# Patient Record
Sex: Female | Born: 1995 | Race: White | Hispanic: No | Marital: Single | Smoking: Never smoker
Health system: Southern US, Community
[De-identification: ages and names within clinical notes are randomized; demographics above are authoritative.]

## PROBLEM LIST (undated history)

## (undated) DIAGNOSIS — F419 Anxiety disorder, unspecified: Secondary | ICD-10-CM

## (undated) HISTORY — PX: BREAST SURGERY: SHX581

---

## 2016-06-14 ENCOUNTER — Encounter: Payer: Self-pay | Admitting: Emergency Medicine

## 2016-06-14 ENCOUNTER — Emergency Department: Payer: BLUE CROSS/BLUE SHIELD

## 2016-06-14 ENCOUNTER — Emergency Department
Admission: EM | Admit: 2016-06-14 | Discharge: 2016-06-14 | Disposition: A | Discharge status: Home / Self Care | Payer: BLUE CROSS/BLUE SHIELD | Attending: Emergency Medicine | Admitting: Emergency Medicine

## 2016-06-14 DIAGNOSIS — J069 Acute upper respiratory infection, unspecified: Secondary | ICD-10-CM | POA: Diagnosis not present

## 2016-06-14 DIAGNOSIS — B9789 Other viral agents as the cause of diseases classified elsewhere: Secondary | ICD-10-CM

## 2016-06-14 DIAGNOSIS — R5383 Other fatigue: Secondary | ICD-10-CM | POA: Diagnosis present

## 2016-06-14 HISTORY — DX: Anxiety disorder, unspecified: F41.9

## 2016-06-14 LAB — CBC
HCT: 43.9 % (ref 35.0–47.0)
Hemoglobin: 14.6 g/dL (ref 12.0–16.0)
MCH: 29.6 pg (ref 26.0–34.0)
MCHC: 33.3 g/dL (ref 32.0–36.0)
MCV: 88.7 fL (ref 80.0–100.0)
PLATELETS: 313 10*3/uL (ref 150–440)
RBC: 4.95 MIL/uL (ref 3.80–5.20)
RDW: 13.1 % (ref 11.5–14.5)
WBC: 10.5 10*3/uL (ref 3.6–11.0)

## 2016-06-14 LAB — COMPREHENSIVE METABOLIC PANEL
ALK PHOS: 75 U/L (ref 38–126)
ALT: 15 U/L (ref 14–54)
AST: 26 U/L (ref 15–41)
Albumin: 4.3 g/dL (ref 3.5–5.0)
Anion gap: 9 (ref 5–15)
BUN: 14 mg/dL (ref 6–20)
CALCIUM: 9.9 mg/dL (ref 8.9–10.3)
CHLORIDE: 105 mmol/L (ref 101–111)
CO2: 25 mmol/L (ref 22–32)
CREATININE: 0.79 mg/dL (ref 0.44–1.00)
Glucose, Bld: 99 mg/dL (ref 65–99)
Potassium: 3.7 mmol/L (ref 3.5–5.1)
SODIUM: 139 mmol/L (ref 135–145)
Total Bilirubin: 0.4 mg/dL (ref 0.3–1.2)
Total Protein: 7.9 g/dL (ref 6.5–8.1)

## 2016-06-14 LAB — LIPASE, BLOOD: Lipase: 22 U/L (ref 11–51)

## 2016-06-14 LAB — POCT RAPID STREP A: STREPTOCOCCUS, GROUP A SCREEN (DIRECT): NEGATIVE

## 2016-06-14 LAB — MONONUCLEOSIS SCREEN: Mono Screen: NEGATIVE

## 2016-06-14 MED ORDER — ONDANSETRON HCL 4 MG/2ML IJ SOLN
INTRAMUSCULAR | Status: AC
  Administered 2016-06-14: 4 mg via INTRAVENOUS
  Filled 2016-06-14: qty 2

## 2016-06-14 MED ORDER — ONDANSETRON HCL 4 MG/2ML IJ SOLN
4.0000 mg | INTRAMUSCULAR | Status: AC
  Administered 2016-06-14: 4 mg via INTRAVENOUS

## 2016-06-14 MED ORDER — ONDANSETRON 4 MG PO TBDP: ORAL_TABLET | ORAL | 0 refills | Status: AC

## 2016-06-14 MED ORDER — SODIUM CHLORIDE 0.9 % IV BOLUS (SEPSIS)
1000.0000 mL | INTRAVENOUS | Status: AC
  Administered 2016-06-14: 1000 mL via INTRAVENOUS

## 2016-06-14 NOTE — ED Provider Notes (Signed)
[facility] Emergency Department Provider Note  ____________________________________________   First MD Initiated Contact with Patient 06/14/16 1655     (approximate)  I have reviewed the triage vital signs and the nursing notes.   HISTORY  Chief Complaint Sore Throat and Fatigue    HPI Barbara Swanson is a 20 y.o. female with no significant past medical history who is currently a senior at [employer] who presents for evaluation of a constellation of symptoms including gradually worsening sore throat, fatigue, general malaise, body aches, feeling like she may pass out, nausea, decreased appetite.  She has felt subjective fever but no measured fever.  She states that she has had her tonsils out but has had strep as recently as a month ago even after the surgery.  She denies shortness of breath but has had a cough that causes pain in her chest.  She has had nasal congestion and runny nose.  She feels some swollen lymph nodes on both sides of her neck.  She denies abdominal pain, dysuria.She describes her symptoms as severe and nothing is making it better or worse.   Past Medical History:  Diagnosis Date  . Anxiety     There are no active problems to display for this patient.   Past Surgical History:  Procedure Laterality Date  . BREAST SURGERY      Prior to Admission medications   Medication Sig Start Date End Date Taking? Authorizing Provider  ondansetron (ZOFRAN ODT) 4 MG disintegrating tablet Allow 1-2 tablets to dissolve in your mouth every 8 hours as needed for nausea/vomiting 06/14/16   Loleta Rose, MD    Allergies Sulfa antibiotics  No family history on file.  Social History Social History  Substance Use Topics  . Smoking status: Never Smoker  . Smokeless tobacco: Never Used  . Alcohol use Yes     Comment: 1-2 drinks/week    Review of Systems Constitutional: Subjective fever/chills with general malaise and generalized weakness Eyes: No  visual changes. ENT: sore throat, no difficulty speaking or swallowing Cardiovascular: Chest pain only with cough Respiratory: Denies shortness of breath, mild intermittent dry cough Gastrointestinal: No abdominal pain.  Nausea with some dry heaves, decreased appetite.  No diarrhea.  No constipation. Genitourinary: Negative for dysuria. Musculoskeletal: Negative for back pain. Skin: Negative for rash. Neurological: Negative for headaches, focal weakness or numbness.  Generalized weakness, dizziness, lightheadedness  10-point ROS otherwise negative.  ____________________________________________   PHYSICAL EXAM:  VITAL SIGNS: ED Triage Vitals  Enc Vitals Group     BP 06/14/16 1534 (!) 130/99     Pulse Rate 06/14/16 1534 89     Resp 06/14/16 1534 18     Temp 06/14/16 1534 98.3 F (36.8 C)     Temp Source 06/14/16 1534 Oral     SpO2 06/14/16 1534 98 %     Weight 06/14/16 1534 120 lb (54.4 kg)     Height 06/14/16 1534 5\' 5"  (1.651 m)     Head Circumference --      Peak Flow --      Pain Score 06/14/16 1535 4     Pain Loc --      Pain Edu? --      Excl. in GC? --     Constitutional: Alert and oriented. Well appearing and in no acute distress. Eyes: Conjunctivae are normal. PERRL. EOMI. Head: Atraumatic. Nose: +congestion/rhinnorhea. Mouth/Throat: Mucous membranes are moist.  Oropharynx non-erythematous.Tonsils are surgically absent Neck: No stridor.  No  meningeal signs.  No palpable lymphadenopathy in her neck Cardiovascular: Normal rate, regular rhythm. Good peripheral circulation. Grossly normal heart sounds. Respiratory: Normal respiratory effort.  No retractions. Lungs CTAB. Gastrointestinal: Soft and nontender. No distention.  Musculoskeletal: No lower extremity tenderness nor edema. No gross deformities of extremities. Neurologic:  Normal speech and language. No gross focal neurologic deficits are appreciated.  Skin:  Skin is warm, dry and intact. No rash  noted. Psychiatric: Mood and affect are Somewhat depressed but essentially normal. Speech and behavior are normal.  ____________________________________________   LABS (all labs ordered are listed, but only abnormal results are displayed)  Labs Reviewed  LIPASE, BLOOD  COMPREHENSIVE METABOLIC PANEL  CBC  MONONUCLEOSIS SCREEN  URINALYSIS COMPLETEWITH MICROSCOPIC ([facility] ONLY)  POCT RAPID STREP A  POC URINE PREG, ED   ____________________________________________  EKG  None - EKG not ordered by ED physician ____________________________________________  RADIOLOGY   Dg Chest 2 View  Result Date: 06/14/2016 CLINICAL DATA:  Patient presents to the ED with sore throat x 1 week. Patient states she woke up yesterday feeling very tired and "out of it". Patient reports blurry vision yesterday evening and this morning which has resolved. Patient states.*comment was truncated* EXAM: CHEST  2 VIEW COMPARISON:  None. FINDINGS: Midline trachea.  Normal heart size and mediastinal contours. Sharp costophrenic angles.  No pneumothorax.  Clear lungs. IMPRESSION: No active cardiopulmonary disease. Electronically Signed   By: Jeronimo GreavesKyle  Talbot M.D.   On: 06/14/2016 17:34    ____________________________________________   PROCEDURES  Procedure(s) performed:   Procedures   Critical Care performed: No ____________________________________________   INITIAL IMPRESSION / ASSESSMENT AND PLAN / ED COURSE  Pertinent labs & imaging results that were available during my care of the patient were reviewed by me and considered in my medical decision making (see chart for details).  Signs and symptoms are most consistent with a viral URI with a cough.  I have added on a mono test and given her complaint of cough with some associated chest pain, I have ordered a chest x-ray.  We are giving a liter of fluids for her general malaise and fatigue and the fact that she has not been eating or drinking well and some  Zofran.  Given the symptoms have been gradual in onset over multiple days and the fact that Tamiflu is not particularly effective anyway, I will not test for influenza at this time.  She is comfortable with the plan.   Clinical Course as of Jun 15 1851  [facility]  1837 Chest x-ray was normal, mono screen was negative.  Patient is very well-appearing at this time with normal vital signs and is talking on her phone without any issues.  I gave her my usual and customary viral URI recommendations and return precautions.  [CF]    Clinical Course User Index [CF] Loleta Roseory Meyer Dockery, MD    ____________________________________________  FINAL CLINICAL IMPRESSION(S) / ED DIAGNOSES  Final diagnoses:  Viral URI with cough     MEDICATIONS GIVEN DURING THIS VISIT:  Medications  sodium chloride 0.9 % bolus 1,000 mL (1,000 mLs Intravenous New Bag/Given 06/14/16 1708)  ondansetron (ZOFRAN) injection 4 mg (4 mg Intravenous Given 06/14/16 1708)     NEW OUTPATIENT MEDICATIONS STARTED DURING THIS VISIT:  New Prescriptions   ONDANSETRON (ZOFRAN ODT) 4 MG DISINTEGRATING TABLET    Allow 1-2 tablets to dissolve in your mouth every 8 hours as needed for nausea/vomiting    Modified Medications   No medications  on file    Discontinued Medications   No medications on file     Note:  This document was prepared using Dragon voice recognition software and may include unintentional dictation errors.    Loleta Roseory Saliou Barnier, MD 06/14/16 [phone]

## 2016-06-14 NOTE — ED Notes (Signed)
Pt tearful in room. States that she does not like ivs. Pt with hx anxiety. pts throat slightly red, painful 5/10.

## 2016-06-14 NOTE — ED Triage Notes (Addendum)
Patient presents to the ED with sore throat x 1 week.  Patient states she woke up yesterday feeling very tired and "out of it".  Patient reports blurry vision yesterday evening and this morning which has resolved.  Patient states yesterday she started vomiting if she ate.  Patient reports vomiting x 1 today.  Patient reports decreased urination.  Patient reports drinking alcohol 3 days ago and not drinking very much water.  Patient reports, "I have bad anxiety."  Patient appears calm and cooperative at this time.  Skin is warm and dry.  No obvious distress at this time. Patient denies abdominal pain.

## 2016-06-14 NOTE — Discharge Instructions (Signed)

## 2016-09-03 ENCOUNTER — Encounter: Payer: Self-pay | Admitting: Emergency Medicine

## 2016-09-03 ENCOUNTER — Emergency Department
Admission: EM | Admit: 2016-09-03 | Discharge: 2016-09-03 | Disposition: A | Discharge status: Home / Self Care | Payer: BLUE CROSS/BLUE SHIELD | Attending: Emergency Medicine | Admitting: Emergency Medicine

## 2016-09-03 DIAGNOSIS — W25XXXA Contact with sharp glass, initial encounter: Secondary | ICD-10-CM | POA: Diagnosis not present

## 2016-09-03 DIAGNOSIS — Y929 Unspecified place or not applicable: Secondary | ICD-10-CM | POA: Insufficient documentation

## 2016-09-03 DIAGNOSIS — Y999 Unspecified external cause status: Secondary | ICD-10-CM | POA: Insufficient documentation

## 2016-09-03 DIAGNOSIS — Y9389 Activity, other specified: Secondary | ICD-10-CM | POA: Insufficient documentation

## 2016-09-03 DIAGNOSIS — S61311A Laceration without foreign body of left index finger with damage to nail, initial encounter: Secondary | ICD-10-CM | POA: Insufficient documentation

## 2016-09-03 NOTE — Discharge Instructions (Signed)
Keep the wound clean, dry, and covered as needed. [facility]

## 2016-09-03 NOTE — ED Notes (Signed)
Pt reports cut left forefinger on broken wine glass, avulsion noted to tip of finger, cleaned with sterile saline, bleeding controlled at this time

## 2016-09-03 NOTE — ED Provider Notes (Signed)
[facility] Emergency Department Provider Note ____________________________________________  Time seen: 1830  I have reviewed the triage vital signs and the nursing notes.  HISTORY  Chief Complaint  Extremity Laceration   HPI Barbara Swanson is a 21 y.o. female presents to the ED for treatment of a superficial shave laceration to the tip of her left index fingertip. She describes washing a drinking glass, when she accidentally cut the fingertip. She had a hard time getting the finger to stop bleeding, that is what prompted her to report to the ED. She denies any other injury at this time.   Past Medical History:  Diagnosis Date  . Anxiety     There are no active problems to display for this patient.   Past Surgical History:  Procedure Laterality Date  . BREAST SURGERY      Prior to Admission medications   Medication Sig Start Date End Date Taking? Authorizing Provider  ondansetron (ZOFRAN ODT) 4 MG disintegrating tablet Allow 1-2 tablets to dissolve in your mouth every 8 hours as needed for nausea/vomiting 06/14/16   Loleta Rose, MD    Allergies Sulfa antibiotics  No family history on file.  Social History Social History  Substance Use Topics  . Smoking status: Never Smoker  . Smokeless tobacco: Never Used  . Alcohol use Yes     Comment: 1-2 drinks/week    Review of Systems  Constitutional: Negative for fever. Musculoskeletal: Negative for back pain. Skin: Negative for rash. Left fingertip lac as above.  Neurological: Negative for headaches, focal weakness or numbness. ____________________________________________  PHYSICAL EXAM:  VITAL SIGNS: ED Triage Vitals [09/03/16 1748]  Enc Vitals Group     BP 127/81     Pulse Rate 72     Resp 18     Temp 98.6 F (37 C)     Temp Source Oral     SpO2 98 %     Weight 125 lb (56.7 kg)     Height 5\' 6"  (1.676 m)     Head Circumference      Peak Flow      Pain Score 6     Pain Loc    Pain Edu?      Excl. in GC?     Constitutional: Alert and oriented. Well appearing and in no distress. Head: Normocephalic and atraumatic. Cardiovascular:  Normal distal pulses. Respiratory: Normal respiratory effort. Musculoskeletal: Normal composite fist on the left. Normal finger ROM at the DIP. Nontender with normal range of motion in all extremities.  Neurologic:  Normal gross sensation.  Normal speech and language. No gross focal neurologic deficits are appreciated. Skin:  Skin is warm, dry and intact. No rash noted. Left index with a shallow, 1 cm diameter shave laceration to the distal tip. ____________________________________________  PROCEDURES  LACERATION REPAIR Performed by: Lissa Hoard Authorized by: Lissa Hoard Consent: Verbal consent obtained. Risks and benefits: risks, benefits and alternatives were discussed Consent given by: patient Patient identity confirmed: provided demographic data Prepped and Draped in normal sterile fashion Wound explored  Laceration Location: left index finger tip  Laceration Length: 1cm  No Foreign Bodies seen or palpated  Irrigation method: syringe Amount of cleaning: standard  Skin closure: cyanoacrylate wound adhesive.   Patient tolerance: Patient tolerated the procedure well with no immediate complications. ____________________________________________  INITIAL IMPRESSION / ASSESSMENT AND PLAN / ED COURSE  Patient with wound adhesive repair to the left index finger for a superficial shave laceration.  She is discharged [facility]  ____________________________________________  FINAL CLINICAL IMPRESSION(S) / ED DIAGNOSES  Final diagnoses:  Laceration of left index finger without foreign body with damage to nail, initial encounter      Lissa HoardJenise V Bacon Delanda Bulluck, PA-C 09/03/16 1850    Jene Everyobert Kinner, MD 09/03/16 [phone]

## 2016-09-03 NOTE — ED Triage Notes (Signed)
Pt reports cutting ring finger of left hand today on glass. Laceration noted to tip of finger bleeding, controlled with dressing.

## 2017-05-26 IMAGING — CR DG CHEST 2V
1 series · 2 of 2 positions shown · non-contrast
Comparison: None.

CLINICAL DATA: Patient presents to the ED with sore throat x 1
week. Patient states she woke up yesterday feeling very tired and
"out of it". Patient reports blurry vision yesterday evening and
this morning which has resolved. Patient states...*comment was
truncated*

EXAM:
CHEST  2 VIEW

[Series 1: dg chest 2 view · 0.14mm/px · 2 of 2 slices shown]
[im 1/2]
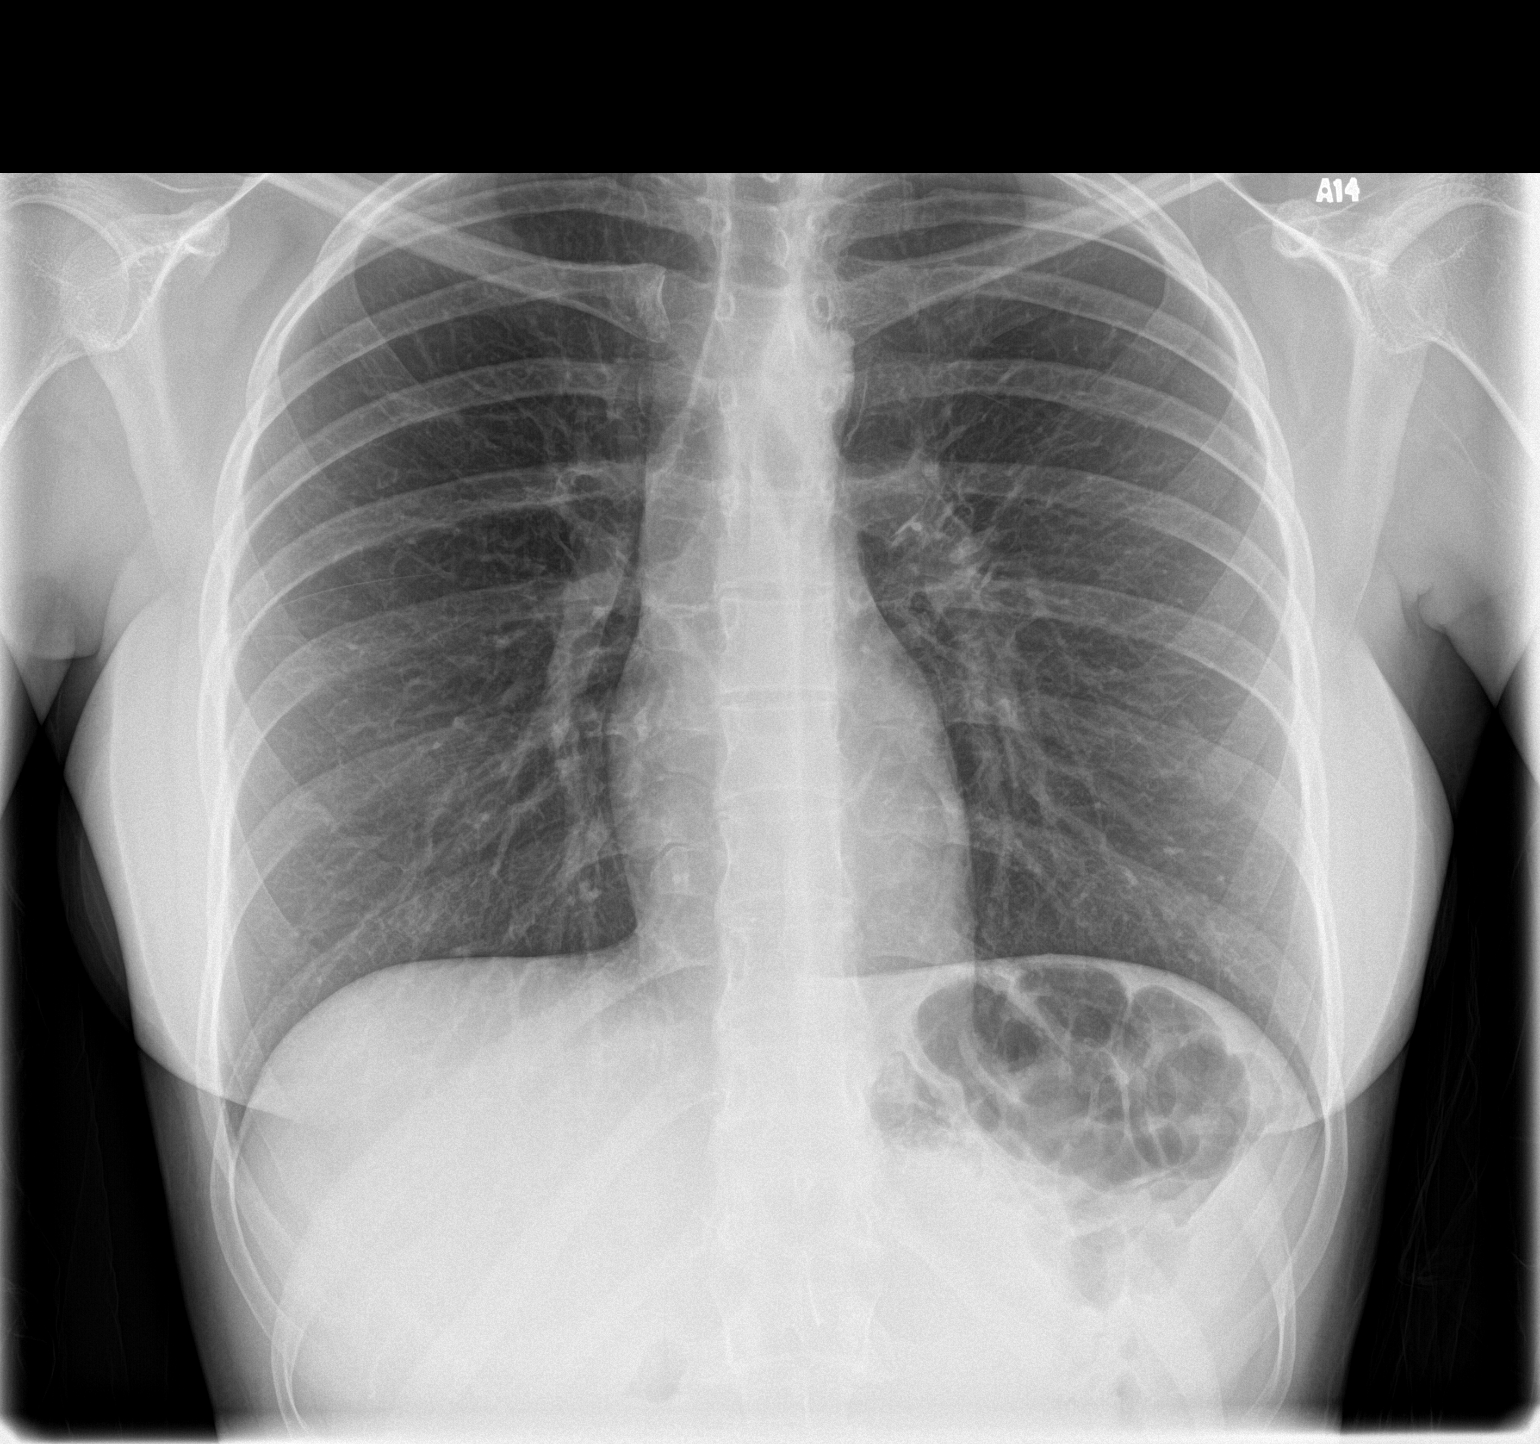
[im 2/2]
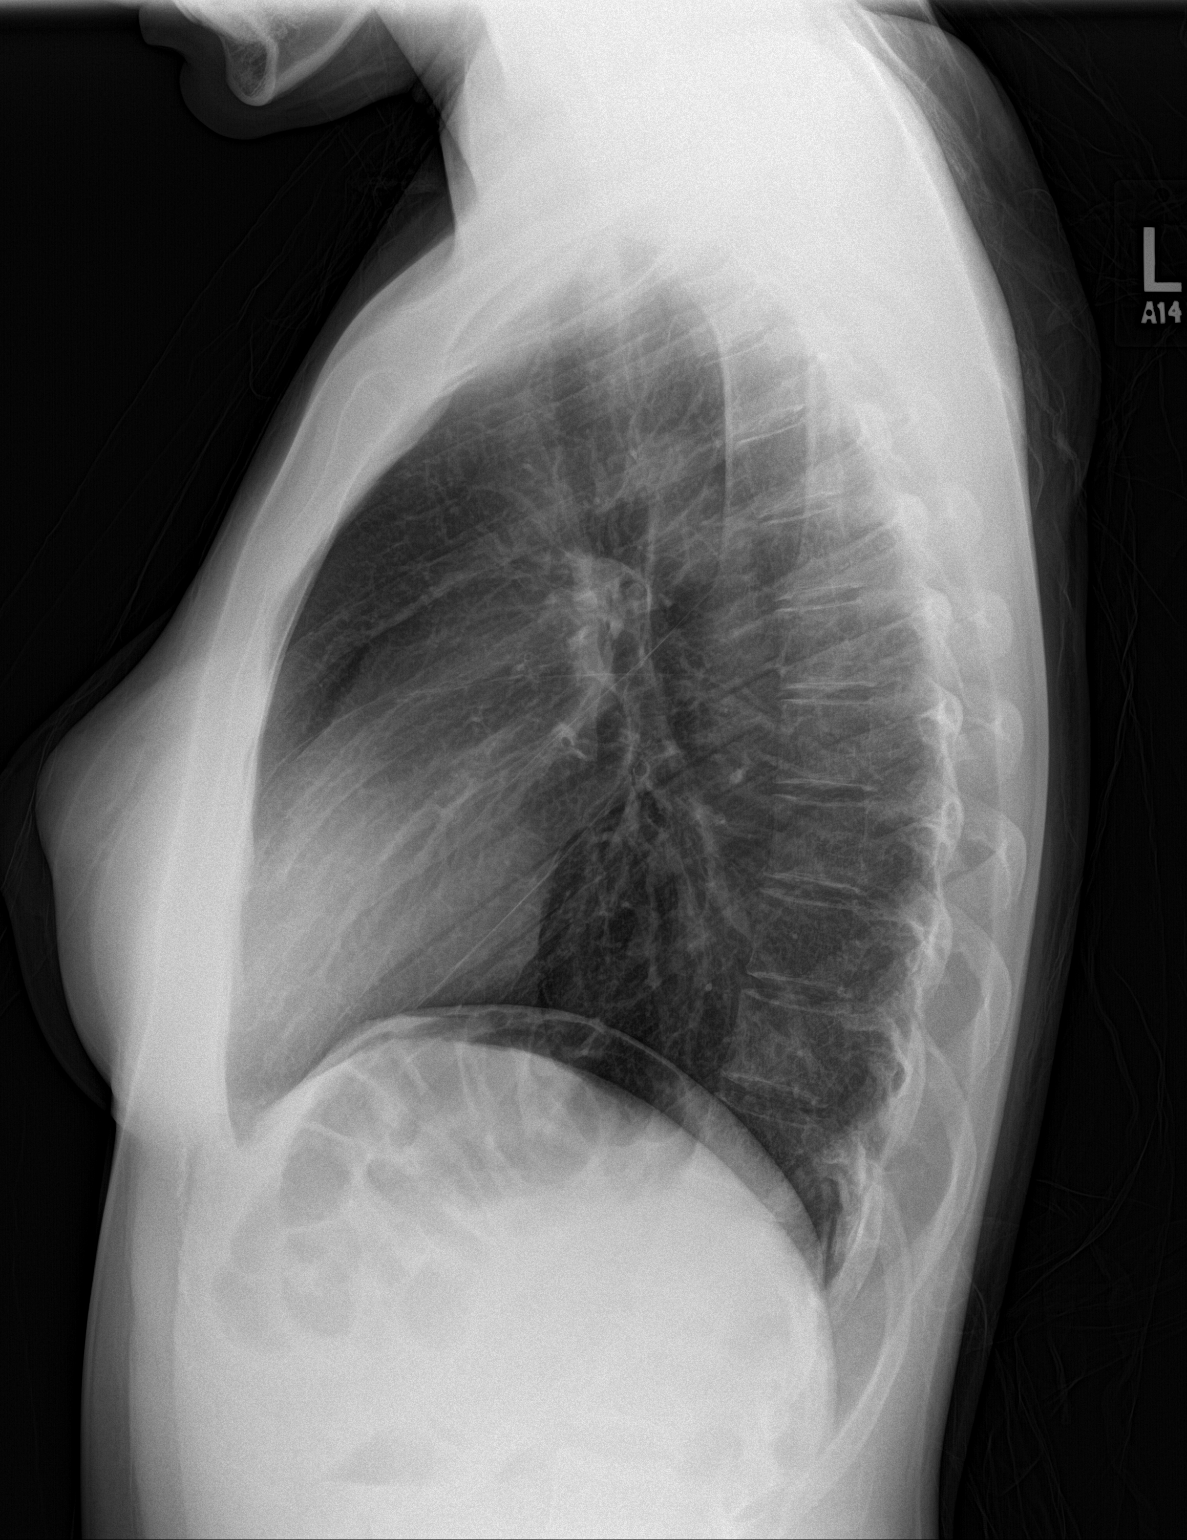

[2 of 2 positions shown; findings below may reference images not displayed]

FINDINGS: Midline trachea.  Normal heart size and mediastinal contours.

Sharp costophrenic angles.  No pneumothorax.  Clear lungs.
IMPRESSION: No active cardiopulmonary disease.
